# Patient Record
Sex: Male | Born: 1960 | Race: White | Hispanic: No | Marital: Married | Smoking: Never smoker
Health system: Southern US, Community
[De-identification: ages and names within clinical notes are randomized; demographics above are authoritative.]

## PROBLEM LIST (undated history)

## (undated) DIAGNOSIS — K581 Irritable bowel syndrome with constipation: Secondary | ICD-10-CM

## (undated) DIAGNOSIS — G8929 Other chronic pain: Secondary | ICD-10-CM

## (undated) DIAGNOSIS — G629 Polyneuropathy, unspecified: Secondary | ICD-10-CM

## (undated) DIAGNOSIS — F32A Depression, unspecified: Secondary | ICD-10-CM

## (undated) DIAGNOSIS — Z9689 Presence of other specified functional implants: Secondary | ICD-10-CM

## (undated) DIAGNOSIS — R413 Other amnesia: Secondary | ICD-10-CM

## (undated) DIAGNOSIS — L570 Actinic keratosis: Secondary | ICD-10-CM

## (undated) DIAGNOSIS — F329 Major depressive disorder, single episode, unspecified: Secondary | ICD-10-CM

## (undated) DIAGNOSIS — G43909 Migraine, unspecified, not intractable, without status migrainosus: Secondary | ICD-10-CM

## (undated) DIAGNOSIS — M797 Fibromyalgia: Secondary | ICD-10-CM

## (undated) HISTORY — DX: Actinic keratosis: L57.0

---

## 2018-05-16 ENCOUNTER — Ambulatory Visit
Admission: EM | Admit: 2018-05-16 | Discharge: 2018-05-16 | Disposition: A | Discharge status: Home / Self Care | Payer: Medicare Other | Attending: Family Medicine | Admitting: Family Medicine

## 2018-05-16 ENCOUNTER — Encounter: Payer: Self-pay | Admitting: Emergency Medicine

## 2018-05-16 DIAGNOSIS — J111 Influenza due to unidentified influenza virus with other respiratory manifestations: Secondary | ICD-10-CM | POA: Insufficient documentation

## 2018-05-16 DIAGNOSIS — J101 Influenza due to other identified influenza virus with other respiratory manifestations: Secondary | ICD-10-CM | POA: Diagnosis not present

## 2018-05-16 HISTORY — DX: Other chronic pain: G89.29

## 2018-05-16 HISTORY — DX: Polyneuropathy, unspecified: G62.9

## 2018-05-16 HISTORY — DX: Irritable bowel syndrome with constipation: K58.1

## 2018-05-16 HISTORY — DX: Migraine, unspecified, not intractable, without status migrainosus: G43.909

## 2018-05-16 HISTORY — DX: Fibromyalgia: M79.7

## 2018-05-16 HISTORY — DX: Major depressive disorder, single episode, unspecified: F32.9

## 2018-05-16 HISTORY — DX: Presence of other specified functional implants: Z96.89

## 2018-05-16 HISTORY — DX: Other amnesia: R41.3

## 2018-05-16 HISTORY — DX: Depression, unspecified: F32.A

## 2018-05-16 LAB — RAPID INFLUENZA A&B ANTIGENS
Influenza A (ARMC): POSITIVE — AB
Influenza B (ARMC): NEGATIVE

## 2018-05-16 MED ORDER — OSELTAMIVIR PHOSPHATE 75 MG PO CAPS: 75.0000 mg | ORAL_CAPSULE | Freq: Two times a day (BID) | ORAL | 0 refills | Status: AC

## 2018-05-16 MED ORDER — OSELTAMIVIR PHOSPHATE 75 MG PO CAPS: 75.0000 mg | ORAL_CAPSULE | Freq: Two times a day (BID) | ORAL | 0 refills | Status: DC

## 2018-05-16 NOTE — ED Triage Notes (Signed)
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

## 2018-05-16 NOTE — Discharge Instructions (Signed)
You have the flu.  Medication as prescribed.  Try mag citrate for constipation. I would recommend daily miralax after use of the mag citrate.  Take care  Dr. Lacinda Axon

## 2018-05-16 NOTE — ED Provider Notes (Signed)
[facility]    CSN: [id number] Arrival date & time: 05/16/18  0910  History   Chief Complaint Chief Complaint  Patient presents with  . Generalized Body Aches   HPI  58 year old male presents with multiple complaints.  Patient reports that he is not been feeling well since Sunday.  He reports multiple complaints: Postnasal drip, body aches, throat burning, nausea, cough, severe constipation, weakness.  No documented fever.  Patient feels very poorly.  He states that he is most bothered by the body aches as well as a constipation.  He has known IBS with constipation and is also on chronic pain medication.  No known exacerbating or relieving factors.  Symptoms are severe.  No other complaints.  History reviewed as below. Past Medical History:  Diagnosis Date  . Chronic pain   . Depression   . Fibromyalgia   . Irritable bowel syndrome with constipation   . Memory deficits   . Migraines   . Polyneuropathy   . Spinal cord stimulator status     Home Medications    Prior to Admission medications   Medication Sig Start Date End Date Taking? Authorizing Provider  diclofenac sodium (VOLTAREN) 1 % GEL Apply topically 4 (four) times daily.   Yes [provider]  Lifitegrast Shirley Friar) 5 % SOLN Apply to eye.   Yes [provider]  losartan-hydrochlorothiazide (HYZAAR) 50-12.5 MG tablet Take 1 tablet by mouth daily.   Yes [provider]  PROPRANOLOL HCL PO Take by mouth.   Yes [provider]  tamsulosin (FLOMAX) 0.4 MG CAPS capsule Take 0.4 mg by mouth.   Yes [provider]  buprenorphine (BUTRANS) 20 MCG/HR PTWK patch Butrans 20 mcg/hour transdermal patch    [provider]  Butalbital-APAP-Caffeine 50-300-40 MG CAPS butalbital-acetaminophen-caffeine 50 mg-300 mg-40 mg capsule    [provider]  clonazePAM (KLONOPIN) 0.5 MG tablet clonazepam 0.5 mg tablet    [provider]  cyclobenzaprine (FLEXERIL)  10 MG tablet cyclobenzaprine 10 mg tablet    [provider]  Fe Fum-FA-B Cmp-C-Zn-Mg-Mn-Cu (HEMATINIC PLUS VIT/MINERALS) 106-1 MG TABS Take by mouth.    [provider]  hydrocortisone (ANUCORT-HC) 25 MG suppository Anucort-HC 25 mg suppository    [provider]  oseltamivir (TAMIFLU) 75 MG capsule Take 1 capsule (75 mg total) by mouth every 12 (twelve) hours. 05/16/18   Coral Spikes, DO  Testosterone Enanthate (XYOSTED) 75 MG/0.5ML SOAJ Xyosted 75 mg/0.5 mL subcutaneous auto-injector  Inject 75 mg every week by subcutaneous route.    [provider]  traZODone (DESYREL) 100 MG tablet trazodone 100 mg tablet    [provider]  triamcinolone (KENALOG) 0.1 % paste Place onto teeth.    [provider]  triamcinolone ointment (KENALOG) 0.1 % triamcinolone acetonide 0.1 % topical ointment    [provider]   Social History Social History   Tobacco Use  . Smoking status: Never Smoker  Substance Use Topics  . Alcohol use: Not on file  . Drug use: Not on file     Allergies   Ethyl chloride; Lidocaine; Lyrica [pregabalin]; Penicillins; Phenergan [promethazine hcl]; and Tizanidine   Review of Systems Review of Systems Per HPI  Physical Exam Triage Vital Signs ED Triage Vitals  Enc Vitals Group     BP 05/16/18 0934 101/71     Pulse Rate 05/16/18 0934 76     Resp 05/16/18 0934 16     Temp 05/16/18 0934 98.8 F (37.1 C)  Temp Source 05/16/18 0934 Oral     SpO2 05/16/18 0934 99 %     Weight 05/16/18 0935 175 lb (79.4 kg)     Height 05/16/18 0935 6' (1.829 m)     Head Circumference --      Peak Flow --      Pain Score 05/16/18 0935 7     Pain Loc --      Pain Edu? --      Excl. in [place]? --    Updated Vital Signs BP 101/71 (BP Location: Right Arm)   Pulse 76   Temp 98.8 F (37.1 C) (Oral)   Resp 16   Ht 6' (1.829 m)   Wt 79.4 kg   SpO2 99%   BMI 23.73 kg/m   Visual Acuity Right Eye Distance:   Left Eye  Distance:   Bilateral Distance:    Right Eye Near:   Left Eye Near:    Bilateral Near:     Physical Exam Vitals signs and nursing note reviewed.  Constitutional:      Comments: Appears in pain.  HENT:     Head: Normocephalic and atraumatic.     Mouth/Throat:     Pharynx: Oropharynx is clear. No posterior oropharyngeal erythema.  Eyes:     General:        Right eye: No discharge.        Left eye: No discharge.     Conjunctiva/sclera: Conjunctivae normal.  Cardiovascular:     Rate and Rhythm: Normal rate and regular rhythm.  Pulmonary:     Effort: Pulmonary effort is normal.     Breath sounds: Normal breath sounds.  Abdominal:     General: Bowel sounds are normal. There is no distension.     Palpations: Abdomen is soft.  Neurological:     Mental Status: He is alert.  Psychiatric:        Mood and Affect: Mood normal.        Behavior: Behavior normal.     UC Treatments / Results  Labs (all labs ordered are listed, but only abnormal results are displayed) Labs Reviewed  RAPID INFLUENZA A&B ANTIGENS ([facility] ONLY) - Abnormal; Notable for the following components:      Result Value   Influenza A ([facility]) POSITIVE (*)    All other components within normal limits    EKG None  Radiology No results found.  Procedures Procedures (including critical care time)  Medications Ordered in UC Medications - No data to display  Initial Impression / Assessment and Plan / UC Course  I have reviewed the triage vital signs and the nursing notes.  Pertinent labs & imaging results that were available during my care of the patient were reviewed by me and considered in my medical decision making (see chart for details).    58 year old male presents with influenza.  Given severity of symptoms and lack of improvement, I am placing him on Tamiflu.  Advised mag citrate for constipation followed by MiraLAX daily.  Final Clinical Impressions(s) / UC Diagnoses   Final diagnoses:    Influenza     Discharge Instructions     You have the flu.  Medication as prescribed.  Try mag citrate for constipation. I would recommend daily miralax after use of the mag citrate.  Take care  Dr. Lacinda Axon   ED Prescriptions    Medication Sig Dispense Auth. Provider   oseltamivir (TAMIFLU) 75 MG capsule  (Status: Discontinued) Take 1 capsule (75 mg total)  by mouth every 12 (twelve) hours. 10 capsule Thersa Salt G, DO   oseltamivir (TAMIFLU) 75 MG capsule  (Status: Discontinued) Take 1 capsule (75 mg total) by mouth every 12 (twelve) hours. 10 capsule Thersa Salt G, DO   oseltamivir (TAMIFLU) 75 MG capsule Take 1 capsule (75 mg total) by mouth every 12 (twelve) hours. 10 capsule Coral Spikes, DO     Controlled Substance Prescriptions [place] Controlled Substance Registry consulted? Not Applicable   Coral Spikes, DO 05/16/18 1104

## 2019-07-07 ENCOUNTER — Ambulatory Visit: Payer: Medicare Other | Admitting: Dermatology

## 2019-08-25 ENCOUNTER — Ambulatory Visit (INDEPENDENT_AMBULATORY_CARE_PROVIDER_SITE_OTHER): Payer: Medicare Other | Admitting: Dermatology

## 2019-08-25 ENCOUNTER — Other Ambulatory Visit: Payer: Self-pay

## 2019-08-25 DIAGNOSIS — L739 Follicular disorder, unspecified: Secondary | ICD-10-CM

## 2019-08-25 DIAGNOSIS — Z1283 Encounter for screening for malignant neoplasm of skin: Secondary | ICD-10-CM | POA: Diagnosis not present

## 2019-08-25 DIAGNOSIS — L299 Pruritus, unspecified: Secondary | ICD-10-CM

## 2019-08-25 DIAGNOSIS — L578 Other skin changes due to chronic exposure to nonionizing radiation: Secondary | ICD-10-CM | POA: Diagnosis not present

## 2019-08-25 DIAGNOSIS — L570 Actinic keratosis: Secondary | ICD-10-CM | POA: Diagnosis not present

## 2019-08-25 MED ORDER — EUCRISA 2 % EX OINT: TOPICAL_OINTMENT | CUTANEOUS | 2 refills | Status: DC

## 2019-08-25 NOTE — Progress Notes (Signed)
   Follow-Up Visit   Subjective  Bryce Atkinson is a 59 y.o. male who presents for the following: Annual Exam (UBSE), Follow-up (6 month follow up of AKs of scalp), and Rash (scalp - folliculitis treating with doxycycline 20mg  - well controlled). Patient presents for upper body skin exam for skin cancer screening and mole check.  Wife with pt and contributes to history  The following portions of the chart were reviewed this encounter and updated as appropriate:  Tobacco  Allergies  Meds  Problems  Med Hx  Surg Hx  Fam Hx     Review of Systems:  No other skin or systemic complaints except as noted in HPI or Assessment and Plan.  Objective  Well appearing patient in no apparent distress; mood and affect are within normal limits.  All skin waist up examined.  Objective  Left lat canthus, post neck, scalp (5): Erythematous thin papules/macules with gritty scale.   Objective  Scalp:   Objective  groin: Pruritus of the groin    Assessment & Plan    AK (actinic keratosis) (5) Left lat canthus, post neck, scalp  Destruction of lesion - Left lat canthus, post neck, scalp Complexity: simple   Destruction method: cryotherapy   Informed consent: discussed and consent obtained   Timeout:  patient name, date of birth, surgical site, and procedure verified Lesion destroyed using liquid nitrogen: Yes   Region frozen until ice ball extended beyond lesion: Yes   Outcome: patient tolerated procedure well with no complications   Post-procedure details: wound care instructions given    Folliculitis /rosacea of the scalp Scalp  Folliculitus well controlled - Continue Doxycycline 20mg  bid prn  Pruritus groin Undetermined etiology Patient did not feel Elidel was helpful. Ordered Medications: Crisaborole (EUCRISA) 2 % OINT  Actinic Damage - diffuse scaly erythematous macules with underlying dyspigmentation - Recommend daily broad spectrum sunscreen SPF 30+ to sun-exposed  areas, reapply every 2 hours as needed.  - Call for new or changing lesions.  Return in about 6 months (around 02/25/2020). IMarye Round,  CMA, am acting as scribe for Sarina Ser, MD .  Documentation: I have reviewed the above documentation for accuracy and completeness, and I agree with the above.  Sarina Ser, MD

## 2019-08-25 NOTE — Patient Instructions (Signed)
Cryotherapy Aftercare  . Wash gently with soap and water everyday.   . Apply Vaseline and Band-Aid daily until healed.  

## 2019-08-26 ENCOUNTER — Encounter: Payer: Self-pay | Admitting: Dermatology

## 2019-10-09 ENCOUNTER — Other Ambulatory Visit: Payer: Self-pay

## 2019-10-09 DIAGNOSIS — L299 Pruritus, unspecified: Secondary | ICD-10-CM

## 2019-10-09 MED ORDER — EUCRISA 2 % EX OINT: TOPICAL_OINTMENT | CUTANEOUS | 2 refills | Status: AC

## 2019-10-09 NOTE — Progress Notes (Signed)
Prescription changed to [facility] per patients request.

## 2019-10-21 ENCOUNTER — Encounter: Payer: Self-pay | Admitting: Dermatology

## 2019-10-21 NOTE — Telephone Encounter (Signed)
Patient advised Dr. Nehemiah Massed reviewed pictures sent via MyChart and the spots patient is concerned about seems to be keratin plugs. I told patient if he continues to have problems he should ask MD to do a biopsy, JS

## 2021-01-12 ENCOUNTER — Emergency Department
Admission: EM | Admit: 2021-01-12 | Discharge: 2021-01-12 | Disposition: A | Discharge status: Home / Self Care | Payer: Medicare Other | Attending: Emergency Medicine | Admitting: Emergency Medicine

## 2021-01-12 ENCOUNTER — Emergency Department: Payer: Medicare Other

## 2021-01-12 ENCOUNTER — Other Ambulatory Visit: Payer: Self-pay

## 2021-01-12 DIAGNOSIS — K219 Gastro-esophageal reflux disease without esophagitis: Secondary | ICD-10-CM | POA: Insufficient documentation

## 2021-01-12 DIAGNOSIS — J029 Acute pharyngitis, unspecified: Secondary | ICD-10-CM | POA: Diagnosis present

## 2021-01-12 DIAGNOSIS — R0789 Other chest pain: Secondary | ICD-10-CM

## 2021-01-12 LAB — BASIC METABOLIC PANEL
Anion gap: 9 (ref 5–15)
BUN: 11 mg/dL (ref 8–23)
CO2: 26 mmol/L (ref 22–32)
Calcium: 10.5 mg/dL — ABNORMAL HIGH (ref 8.9–10.3)
Chloride: 104 mmol/L (ref 98–111)
Creatinine, Ser: 0.93 mg/dL (ref 0.61–1.24)
GFR, Estimated: >60 mL/min (ref >60)
Glucose, Bld: 102 mg/dL — ABNORMAL HIGH (ref 70–99)
Potassium: 3.6 mmol/L (ref 3.5–5.1)
Sodium: 139 mmol/L (ref 135–145)

## 2021-01-12 LAB — CBC
HCT: 42.5 % (ref 39.0–52.0)
Hemoglobin: 15.1 g/dL (ref 13.0–17.0)
MCH: 30.8 pg (ref 26.0–34.0)
MCHC: 35.5 g/dL (ref 30.0–36.0)
MCV: 86.6 fL (ref 80.0–100.0)
Platelets: 158 10*3/uL (ref 150–400)
RBC: 4.91 MIL/uL (ref 4.22–5.81)
RDW: 12.7 % (ref 11.5–15.5)
WBC: 9.8 10*3/uL (ref 4.0–10.5)
nRBC: 0 % (ref 0.0–0.2)

## 2021-01-12 LAB — TROPONIN I (HIGH SENSITIVITY): Troponin I (High Sensitivity): 4 ng/L (ref <18)

## 2021-01-12 MED ORDER — METOCLOPRAMIDE HCL 10 MG PO TABS: 10.0000 mg | ORAL_TABLET | Freq: Four times a day (QID) | ORAL | 0 refills | Status: AC | PRN

## 2021-01-12 MED ORDER — SUCRALFATE 1 G PO TABS: 1.0000 g | ORAL_TABLET | Freq: Four times a day (QID) | ORAL | 1 refills | Status: AC

## 2021-01-12 MED ORDER — FAMOTIDINE 20 MG PO TABS: 20.0000 mg | ORAL_TABLET | Freq: Two times a day (BID) | ORAL | 0 refills | Status: AC

## 2021-01-12 NOTE — ED Triage Notes (Signed)
Pt to ED for chest and epigastric pain that started last night. States also feels like cannot get enough oxygen.  Denies n/v Went to doctor today and reports did not see anything wrong with esophagus.  Denies cardiac hx

## 2021-01-12 NOTE — ED Provider Notes (Signed)
[facility] Emergency Department Provider Note  ____________________________________________  Time seen: Approximately 5:02 PM  I have reviewed the triage vital signs and the nursing notes.   HISTORY  Chief Complaint Chest Pain and Shortness of Breath    HPI Bryce Atkinson is a 60 y.o. male with a past history of depression and fibromyalgia who comes ED complaining of sore throat that started last night, radiates down to his upper abdomen.  Not exertional, not pleuritic, worse laying down, no alleviating factors.  Tried Tums last night and 2 hours later he was feeling better.  It does feel like previous episode of GERD.  Feels like burning.   Denies shortness of breath vomiting or diaphoresis.  Past Medical History:  Diagnosis Date   Actinic keratosis    Chronic pain    Depression    Fibromyalgia    Irritable bowel syndrome with constipation    Memory deficits    Migraines    Polyneuropathy    Spinal cord stimulator status      There are no problems to display for this patient.    No past surgical history on file.   Prior to Admission medications   Medication Sig Start Date End Date Taking? Authorizing Provider  famotidine (PEPCID) 20 MG tablet Take 1 tablet (20 mg total) by mouth 2 (two) times daily. 01/12/21  Yes Carrie Mew, MD  metoCLOPramide (REGLAN) 10 MG tablet Take 1 tablet (10 mg total) by mouth every 6 (six) hours as needed. 01/12/21  Yes Carrie Mew, MD  sucralfate (CARAFATE) 1 g tablet Take 1 tablet (1 g total) by mouth 4 (four) times daily. 01/12/21  Yes Carrie Mew, MD  buprenorphine (BUTRANS) 20 MCG/HR PTWK patch Butrans 20 mcg/hour transdermal patch    [provider]  Butalbital-APAP-Caffeine 50-300-40 MG CAPS butalbital-acetaminophen-caffeine 50 mg-300 mg-40 mg capsule    [provider]  clonazePAM (KLONOPIN) 0.5 MG tablet clonazepam 0.5 mg tablet    [provider]  Crisaborole  (EUCRISA) 2 % OINT Apply to skin qd-bid 10/09/19   Ralene Bathe, MD  cyclobenzaprine (FLEXERIL) 10 MG tablet cyclobenzaprine 10 mg tablet    [provider]  diclofenac sodium (VOLTAREN) 1 % GEL Apply topically 4 (four) times daily.    [provider]  Fe Fum-FA-B Cmp-C-Zn-Mg-Mn-Cu (HEMATINIC PLUS VIT/MINERALS) 106-1 MG TABS Take by mouth.    [provider]  hydrocortisone (ANUCORT-HC) 25 MG suppository Anucort-HC 25 mg suppository    [provider]  Lifitegrast Shirley Friar) 5 % SOLN Apply to eye.    [provider]  losartan-hydrochlorothiazide (HYZAAR) 50-12.5 MG tablet Take 1 tablet by mouth daily.    [provider]  oseltamivir (TAMIFLU) 75 MG capsule Take 1 capsule (75 mg total) by mouth every 12 (twelve) hours. 05/16/18   Thersa Salt G, DO  PROPRANOLOL HCL PO Take by mouth.    [provider]  tamsulosin (FLOMAX) 0.4 MG CAPS capsule Take 0.4 mg by mouth.    [provider]  Testosterone Enanthate (XYOSTED) 75 MG/0.5ML SOAJ Xyosted 75 mg/0.5 mL subcutaneous auto-injector  Inject 75 mg every week by subcutaneous route.    [provider]  traZODone (DESYREL) 100 MG tablet trazodone 100 mg tablet    [provider]  triamcinolone (KENALOG) 0.1 % paste Place onto teeth.    [provider]  triamcinolone ointment (KENALOG) 0.1 % triamcinolone acetonide 0.1 % topical ointment    [provider]     Allergies Ethyl chloride,  Lidocaine, Lyrica [pregabalin], Penicillins, Phenergan [promethazine hcl], and Tizanidine   No family history on file.  Social History Social History   Tobacco Use   Smoking status: Never   Smokeless tobacco: Never    Review of Systems  Constitutional:   No fever or chills.  ENT:   Positive sore throat. No rhinorrhea. Cardiovascular:   No chest pain or syncope. Respiratory:   No dyspnea or cough. Gastrointestinal:   Negative for abdominal pain,  vomiting and diarrhea.  Musculoskeletal:   Negative for focal pain or swelling All other systems reviewed and are negative except as documented above in ROS and HPI.  ____________________________________________   PHYSICAL EXAM:  VITAL SIGNS: ED Triage Vitals  Enc Vitals Group     BP 01/12/21 1355 116/77     Pulse Rate 01/12/21 1355 75     Resp 01/12/21 1355 18     Temp 01/12/21 1355 98.1 F (36.7 C)     Temp Source 01/12/21 1355 Oral     SpO2 01/12/21 1355 98 %     Weight 01/12/21 1358 183 lb (83 kg)     Height 01/12/21 1358 6' (1.829 m)     Head Circumference --      Peak Flow --      Pain Score 01/12/21 1357 5     Pain Loc --      Pain Edu? --      Excl. in [place]? --     Vital signs reviewed, nursing assessments reviewed.   Constitutional:   Alert and oriented. Non-toxic appearance. Eyes:   Conjunctivae are normal. EOMI. PERRL. ENT      Head:   Normocephalic and atraumatic.      Nose:   Normal.      Mouth/Throat:   Oropharyngeal erythema      Neck:   No meningismus. Full ROM. Hematological/Lymphatic/Immunilogical:   No cervical lymphadenopathy. Cardiovascular:   RRR. Symmetric bilateral radial and DP pulses.  No murmurs. Cap refill less than 2 seconds. Respiratory:   Normal respiratory effort without tachypnea/retractions. Breath sounds are clear and equal bilaterally. No wheezes/rales/rhonchi. Gastrointestinal:   Soft with mild left upper quadrant tenderness.  Non distended. There is no CVA tenderness.  No rebound, rigidity, or guarding. Genitourinary:   deferred Musculoskeletal:   Normal range of motion in all extremities. No joint effusions.  No lower extremity tenderness.  No edema. Neurologic:   Normal speech and language.  Motor grossly intact. No acute focal neurologic deficits are appreciated.  Skin:    Skin is warm, dry and intact. No rash noted.  No petechiae, purpura, or bullae.  ____________________________________________    LABS (pertinent  positives/negatives) (all labs ordered are listed, but only abnormal results are displayed) Labs Reviewed  BASIC METABOLIC PANEL - Abnormal; Notable for the following components:      Result Value   Glucose, Bld 102 (*)    Calcium 10.5 (*)    All other components within normal limits  CBC  TROPONIN I (HIGH SENSITIVITY)   ____________________________________________   EKG  Interpreted by me  Date: 01/12/2021  Rate: 68  Rhythm: normal sinus rhythm  QRS Axis: normal  Intervals: normal  ST/T Wave abnormalities: normal  Conduction Disutrbances: none  Narrative Interpretation: unremarkable     ____________________________________________    RADIOLOGY  DG Chest 2 View  Result Date: 01/12/2021 CLINICAL DATA:  Chest and epigastric pain since last night EXAM: CHEST - 2 VIEW COMPARISON:  None FINDINGS: Intraspinal stimulator projects over midthoracic  spine. Normal heart size, mediastinal contours, and pulmonary vascularity. Calcified granuloma RIGHT mid lung. No pulmonary infiltrate, pleural effusion, or pneumothorax. Osseous structures unremarkable. IMPRESSION: Old granulomatous disease. No acute abnormalities. Electronically Signed   By: Lavonia Dana M.D.   On: 01/12/2021 15:06    ____________________________________________   PROCEDURES Procedures  ____________________________________________    CLINICAL IMPRESSION / ASSESSMENT AND PLAN / ED COURSE  Medications ordered in the ED: Medications - No data to display  Pertinent labs & imaging results that were available during my care of the patient were reviewed by me and considered in my medical decision making (see chart for details).  Bryce Atkinson was evaluated in Emergency Department on 01/12/2021 for the symptoms described in the history of present illness. He was evaluated in the context of the global COVID-19 pandemic, which necessitated consideration that the patient might be at risk for infection with the SARS-CoV-2  virus that causes COVID-19. Institutional protocols and algorithms that pertain to the evaluation of patients at risk for COVID-19 are in a state of rapid change based on information released by regulatory bodies including the CDC and federal and state organizations. These policies and algorithms were followed during the patient's care in the ED.   Patient presents with sore throat, atypical chest pain. Considering the patient's symptoms, medical history, and physical examination today, I have low suspicion for ACS, PE, TAD, pneumothorax, carditis, mediastinitis, pneumonia, CHF, or sepsis. Presentation is consistent with GERD and gastritis.  Will treat with Carafate Reglan and Pepcid.  Vital signs are normal, EKG is normal.  Chest x-ray viewed and interpreted by me, appears normal.  Radiology report reviewed.  Labs are all unremarkable.  Troponin sent by triage protocol, but pain is noncardiac and cardiac work-up is not indicated.      ____________________________________________   FINAL CLINICAL IMPRESSION(S) / ED DIAGNOSES    Final diagnoses:  Atypical chest pain  Gastroesophageal reflux disease, unspecified whether esophagitis present     ED Discharge Orders          Ordered    sucralfate (CARAFATE) 1 g tablet  4 times daily        01/12/21 1702    famotidine (PEPCID) 20 MG tablet  2 times daily        01/12/21 1702    metoCLOPramide (REGLAN) 10 MG tablet  Every 6 hours PRN        01/12/21 1702            Portions of this note were generated with dragon dictation software. Dictation errors may occur despite best attempts at proofreading.    Carrie Mew, MD 01/12/21 1705

## 2021-12-21 ENCOUNTER — Ambulatory Visit: Payer: TRICARE For Life (TFL) | Admitting: Dermatology

## 2021-12-21 DIAGNOSIS — Z1283 Encounter for screening for malignant neoplasm of skin: Secondary | ICD-10-CM

## 2021-12-21 DIAGNOSIS — Z5111 Encounter for antineoplastic chemotherapy: Secondary | ICD-10-CM

## 2021-12-21 DIAGNOSIS — L570 Actinic keratosis: Secondary | ICD-10-CM | POA: Diagnosis not present

## 2021-12-21 DIAGNOSIS — L814 Other melanin hyperpigmentation: Secondary | ICD-10-CM

## 2021-12-21 DIAGNOSIS — L578 Other skin changes due to chronic exposure to nonionizing radiation: Secondary | ICD-10-CM

## 2021-12-21 DIAGNOSIS — Z79899 Other long term (current) drug therapy: Secondary | ICD-10-CM

## 2021-12-21 DIAGNOSIS — L821 Other seborrheic keratosis: Secondary | ICD-10-CM

## 2021-12-21 DIAGNOSIS — L820 Inflamed seborrheic keratosis: Secondary | ICD-10-CM | POA: Diagnosis not present

## 2021-12-21 DIAGNOSIS — L209 Atopic dermatitis, unspecified: Secondary | ICD-10-CM | POA: Diagnosis not present

## 2021-12-21 DIAGNOSIS — D1800 Hemangioma unspecified site: Secondary | ICD-10-CM

## 2021-12-21 DIAGNOSIS — D229 Melanocytic nevi, unspecified: Secondary | ICD-10-CM

## 2021-12-21 MED ORDER — MOMETASONE FUROATE 0.1 % EX CREA: TOPICAL_CREAM | CUTANEOUS | 1 refills | Status: AC

## 2021-12-21 MED ORDER — FLUOROURACIL 5 % EX CREA: TOPICAL_CREAM | CUTANEOUS | 0 refills | Status: AC

## 2021-12-21 NOTE — Progress Notes (Unsigned)
Follow-Up Visit   Subjective  Bryce Atkinson is a 61 y.o. male who presents for the following: Annual Exam (Itching on the buttocks and arms - he has multiple irritated skin lesions scattered on the trunk, and extremities that he would like checked today). The patient presents for Total-Body Skin Exam (TBSE) for skin cancer screening and mole check.  The patient has spots, moles and lesions to be evaluated, some may be new or changing and the patient has concerns that these could be cancer.  The following portions of the chart were reviewed this encounter and updated as appropriate:   Tobacco  Allergies  Meds  Problems  Med Hx  Surg Hx  Fam Hx     Review of Systems:  No other skin or systemic complaints except as noted in HPI or Assessment and Plan.  Objective  Well appearing patient in no apparent distress; mood and affect are within normal limits.  A full examination was performed including scalp, head, eyes, ears, nose, lips, neck, chest, axillae, abdomen, back, buttocks, bilateral upper extremities, bilateral lower extremities, hands, feet, fingers, toes, fingernails, and toenails. All findings within normal limits unless otherwise noted below.  arms and hands x 19, L lower leg x 1 (20) Erythematous stuck-on, waxy papule or plaque  Face, scalp, ears x 6 (6) Erythematous thin papules/macules with gritty scale.   Buttocks Clear today.   Assessment & Plan   Inflamed seborrheic keratosis (20) arms and hands x 19, L lower leg x 1 Symptomatic, irritating, patient would like treated. Destruction of lesion - arms and hands x 19, L lower leg x 1 Complexity: simple   Destruction method: cryotherapy   Informed consent: discussed and consent obtained   Timeout:  patient name, date of birth, surgical site, and procedure verified Lesion destroyed using liquid nitrogen: Yes   Region frozen until ice ball extended beyond lesion: Yes   Outcome: patient tolerated procedure well with no  complications   Post-procedure details: wound care instructions given    AK (actinic keratosis) (6) Face, scalp, ears x 6 Destruction of lesion - Face, scalp, ears x 6 Complexity: simple   Destruction method: cryotherapy   Informed consent: discussed and consent obtained   Timeout:  patient name, date of birth, surgical site, and procedure verified Lesion destroyed using liquid nitrogen: Yes   Region frozen until ice ball extended beyond lesion: Yes   Outcome: patient tolerated procedure well with no complications   Post-procedure details: wound care instructions given    Atopic dermatitis,  Buttocks With xerosis and pruritis -   Start CeraVe cream daily.   If itching is persistent start Mometasone 0.1% cream QD-BID PRN.   Atopic dermatitis (eczema) is a chronic, relapsing, pruritic condition that can significantly affect quality of life. It is often associated with allergic rhinitis and/or asthma and can require treatment with topical medications, phototherapy, or in severe cases biologic injectable medication (Dupixent; Adbry) or Oral JAK inhibitors.  Topical steroids (such as triamcinolone, fluocinolone, fluocinonide, mometasone, clobetasol, halobetasol, betamethasone, hydrocortisone) can cause thinning and lightening of the skin if they are used for too long in the same area. Your physician has selected the right strength medicine for your problem and area affected on the body. Please use your medication only as directed by your physician to prevent side effects.   mometasone (ELOCON) 0.1 % cream - Buttocks For itching apply to aa's buttocks QD-BID PRN.  Lentigines - Scattered tan macules - Due to sun exposure - Benign-appearing,  observe - Recommend daily broad spectrum sunscreen SPF 30+ to sun-exposed areas, reapply every 2 hours as needed. - Call for any changes  Seborrheic Keratoses - Stuck-on, waxy, tan-brown papules and/or plaques  - Benign-appearing - Discussed  benign etiology and prognosis. - Observe - Call for any changes  Melanocytic Nevi - Tan-brown and/or pink-flesh-colored symmetric macules and papules - Benign appearing on exam today - Observation - Call clinic for new or changing moles - Recommend daily use of broad spectrum spf 30+ sunscreen to sun-exposed areas.   Hemangiomas - Red papules - Discussed benign nature - Observe - Call for any changes  Actinic Damage - Severe, confluent actinic changes with pre-cancerous actinic keratoses  - Severe, chronic, not at goal, secondary to cumulative UV radiation exposure over time - diffuse scaly erythematous macules and papules with underlying dyspigmentation - Discussed Prescription "Field Treatment" for Severe, Chronic Confluent Actinic Changes with Pre-Cancerous Actinic Keratoses Field treatment involves treatment of an entire area of skin that has confluent Actinic Changes (Sun/ Ultraviolet light damage) and PreCancerous Actinic Keratoses by method of PhotoDynamic Therapy (PDT) and/or prescription Topical Chemotherapy agents such as 5-fluorouracil, 5-fluorouracil/calcipotriene, and/or imiquimod.  The purpose is to decrease the number of clinically evident and subclinical PreCancerous lesions to prevent progression to development of skin cancer by chemically destroying early precancer changes that may or may not be visible.  It has been shown to reduce the risk of developing skin cancer in the treated area. As a result of treatment, redness, scaling, crusting, and open sores may occur during treatment course. One or more than one of these methods may be used and may have to be used several times to control, suppress and eliminate the PreCancerous changes. Discussed treatment course, expected reaction, and possible side effects. - Recommend daily broad spectrum sunscreen SPF 30+ to sun-exposed areas, reapply every 2 hours as needed.  - Staying in the shade or wearing long sleeves, sun glasses  (UVA+UVB protection) and wide brim hats (4-inch brim around the entire circumference of the hat) are also recommended. - Call for new or changing lesions. - Start 5FU/Calcipotriene cream BID x 7 days to the forehead and temples.   Skin cancer screening performed today.  Return in about 6 months (around 06/21/2022) for AK, ISK, and atopic dermatitis follow up patient to see derm in [place].  Luther Redo, CMA, am acting as scribe for Sarina Ser, MD . Documentation: I have reviewed the above documentation for accuracy and completeness, and I agree with the above.  Sarina Ser, MD

## 2021-12-21 NOTE — Patient Instructions (Addendum)
- Start 5-fluorouracil/calcipotriene cream twice a day for 7 days to affected areas including the forehead and temples. Prescription sent to [facility]. Patient advised they will receive an email to purchase the medication online and have it sent to their home. Patient provided with handout reviewing treatment course and side effects and advised to call or message [place] on MyChart with any concerns.   5-Fluorouracil/Calcipotriene Patient Education   Actinic keratoses are the dry, red scaly spots on the skin caused by sun damage. A portion of these spots can turn into skin cancer with time, and treating them can help prevent development of skin cancer.   Treatment of these spots requires removal of the defective skin cells. There are various ways to remove actinic keratoses, including freezing with liquid nitrogen, treatment with creams, or treatment with a blue light procedure in the office.   5-fluorouracil cream is a topical cream used to treat actinic keratoses. It works by interfering with the growth of abnormal fast-growing skin cells, such as actinic keratoses. These cells peel off and are replaced by healthy ones.   5-fluorouracil/calcipotriene is a combination of the 5-fluorouracil cream with a vitamin D analog cream called calcipotriene. The calcipotriene alone does not treat actinic keratoses. However, when it is combined with 5-fluorouracil, it helps the 5-fluorouracil treat the actinic keratoses much faster so that the same results can be achieved with a much shorter treatment time.  INSTRUCTIONS FOR 5-FLUOROURACIL/CALCIPOTRIENE CREAM:   5-fluorouracil/calcipotriene cream typically only needs to be used for 4-7 days. A thin layer should be applied twice a day to the treatment areas recommended by your physician.   If your physician prescribed you separate tubes of 5-fluourouracil and calcipotriene, apply a thin layer of 5-fluorouracil followed by a thin layer of  calcipotriene.   Avoid contact with your eyes, nostrils, and mouth. Do not use 5-fluorouracil/calcipotriene cream on infected or open wounds.   You will develop redness, irritation and some crusting at areas where you have pre-cancer damage/actinic keratoses. IF YOU DEVELOP PAIN, BLEEDING, OR SIGNIFICANT CRUSTING, STOP THE TREATMENT EARLY - you have already gotten a good response and the actinic keratoses should clear up well.  Wash your hands after applying 5-fluorouracil 5% cream on your skin.   A moisturizer or sunscreen with a minimum SPF 30 should be applied each morning.   Once you have finished the treatment, you can apply a thin layer of Vaseline twice a day to irritated areas to soothe and calm the areas more quickly. If you experience significant discomfort, contact your physician.  For some patients it is necessary to repeat the treatment for best results.  SIDE EFFECTS: When using 5-fluorouracil/calcipotriene cream, you may have mild irritation, such as redness, dryness, swelling, or a mild burning sensation. This usually resolves within 2 weeks. The more actinic keratoses you have, the more redness and inflammation you can expect during treatment. Eye irritation has been reported rarely. If this occurs, please let us know.   If you have any trouble using this cream, please call the office. If you have any other questions about this information, please do not hesitate to ask me before you leave the office.   Due to recent changes in healthcare laws, you may see results of your pathology and/or laboratory studies on MyChart before the doctors have had a chance to review them. We understand that in some cases there may be results that are confusing or concerning to you. Please understand that not all results are  received at the same time and often the doctors may need to interpret multiple results in order to provide you with the best plan of care or course of treatment. Therefore, we  ask that you please give [place] 2 business days to thoroughly review all your results before contacting the office for clarification. Should we see a critical lab result, you will be contacted sooner.   If You Need Anything After Your Visit  If you have any questions or concerns for your doctor, please call our main line at [phone] and press option 4 to reach your doctor's medical assistant. If no one answers, please leave a voicemail as directed and we will return your call as soon as possible. Messages left after 4 pm will be answered the following business day.   You may also send [place] a message via [place]. We typically respond to MyChart messages within 1-2 business days.  For prescription refills, please ask your pharmacy to contact our office. Our fax number is [phone].  If you have an urgent issue when the clinic is closed that cannot wait until the next business day, you can page your doctor at the number below.    Please note that while we do our best to be available for urgent issues outside of office hours, we are not available 24/7.   If you have an urgent issue and are unable to reach [place], you may choose to seek medical care at your doctor's office, retail clinic, urgent care center, or emergency room.  If you have a medical emergency, please immediately call 911 or go to the emergency department.  Pager Numbers  - Dr. Nehemiah Massed: [phone]  - Dr. Laurence Ferrari: [phone]  - Dr. Nicole Kindred: [phone]  In the event of inclement weather, please call our main line at [phone] for an update on the status of any delays or closures.  Dermatology Medication Tips: Please keep the boxes that topical medications come in in order to help keep track of the instructions about where and how to use these. Pharmacies typically print the medication instructions only on the boxes and not directly on the medication tubes.   If your medication is too expensive, please contact our office  at [phone] option 4 or send [place] a message through [place].   We are unable to tell what your co-pay for medications will be in advance as this is different depending on your insurance coverage. However, we may be able to find a substitute medication at lower cost or fill out paperwork to get insurance to cover a needed medication.   If a prior authorization is required to get your medication covered by your insurance company, please allow [place] 1-2 business days to complete this process.  Drug prices often vary depending on where the prescription is filled and some pharmacies may offer cheaper prices.  The website www.goodrx.com contains coupons for medications through different pharmacies. The prices here do not account for what the cost may be with help from insurance (it may be cheaper with your insurance), but the website can give you the price if you did not use any insurance.  - You can print the associated coupon and take it with your prescription to the pharmacy.  - You may also stop by our office during regular business hours and pick up a GoodRx coupon card.  - If you need your prescription sent electronically to a different pharmacy, notify our office through [facility] or by phone at [phone] option 4.  Si Usted Necesita Algo Despus de Su Visita  Tambin puede enviarnos un mensaje a travs de Pharmacist, community. Por lo general respondemos a los mensajes de MyChart en el transcurso de 1 a 2 das hbiles.  Para renovar recetas, por favor pida a su farmacia que se ponga en contacto con nuestra oficina. Harland Dingwall de fax es [place] [phone].  Si tiene un asunto urgente cuando la clnica est cerrada y que no puede esperar hasta el siguiente da hbil, puede llamar/localizar a su doctor(a) al nmero que aparece a continuacin.   Por favor, tenga en cuenta que aunque hacemos todo lo posible para estar disponibles para asuntos urgentes fuera del horario de [place], no estamos  disponibles las 24 horas del da, los 7 das de la [place].   Si tiene un problema urgente y no puede comunicarse con nosotros, puede optar por buscar atencin mdica  en el consultorio de su doctor(a), en una clnica privada, en un centro de atencin urgente o en una sala de emergencias.  Si tiene Engineering geologist, por favor llame inmediatamente al 911 o vaya a la sala de emergencias.  Nmeros de bper  - Dr. Nehemiah Massed: [phone]  - Dra. Moye: [phone]  - Dra. Nicole Kindred: [phone]  En caso de inclemencias del [place], por favor llame a Johnsie Kindred principal al [phone] para una actualizacin sobre el [place] de cualquier retraso o cierre.  Consejos para la medicacin en dermatologa: Por favor, guarde las cajas en las que vienen los medicamentos de uso tpico para ayudarle a seguir las instrucciones sobre dnde y cmo usarlos. Las farmacias generalmente imprimen las instrucciones del medicamento slo en las cajas y no directamente en los tubos del [place].   Si su medicamento es muy caro, por favor, pngase en contacto con Zigmund Daniel llamando al [phone] y presione la opcin 4 o envenos un mensaje a travs de Pharmacist, community.   No podemos decirle cul ser su copago por los medicamentos por adelantado ya que esto es diferente dependiendo de la cobertura de su seguro. Sin embargo, es posible que podamos encontrar un medicamento sustituto a Electrical engineer un formulario para que el seguro cubra el medicamento que se considera necesario.   Si se requiere una autorizacin previa para que su compaa de seguros [place] su medicamento, por favor permtanos de 1 a 2 das hbiles para completar este proceso.  Los precios de los medicamentos varan con frecuencia dependiendo del Environmental consultant de dnde se surte la receta y alguna farmacias pueden ofrecer precios ms baratos.  El sitio web www.goodrx.com tiene cupones para medicamentos de Airline pilot. Los precios aqu no  tienen en cuenta lo que podra costar con la ayuda del seguro (puede ser ms barato con su seguro), pero el sitio web puede darle el precio si no utiliz Research scientist (physical sciences).  - Puede imprimir el cupn correspondiente y llevarlo con su receta a la farmacia.  - Tambin puede pasar por nuestra oficina durante el horario de atencin regular y Charity fundraiser una tarjeta de cupones de GoodRx.  - Si necesita que su receta se enve electrnicamente a una farmacia diferente, informe a nuestra oficina a travs de MyChart de [place] o por telfono llamando al [phone] y presione la opcin 4.

## 2021-12-22 ENCOUNTER — Encounter: Payer: Self-pay | Admitting: Dermatology

## 2022-11-10 IMAGING — CR DG CHEST 2V
1 series · 2 of 2 positions shown · non-contrast
Comparison: None

CLINICAL DATA: Chest and epigastric pain since last night

EXAM:
CHEST - 2 VIEW

[Series 1: dg chest 2 view · 0.14mm/px · 2 of 2 slices shown]
[im 1/2]
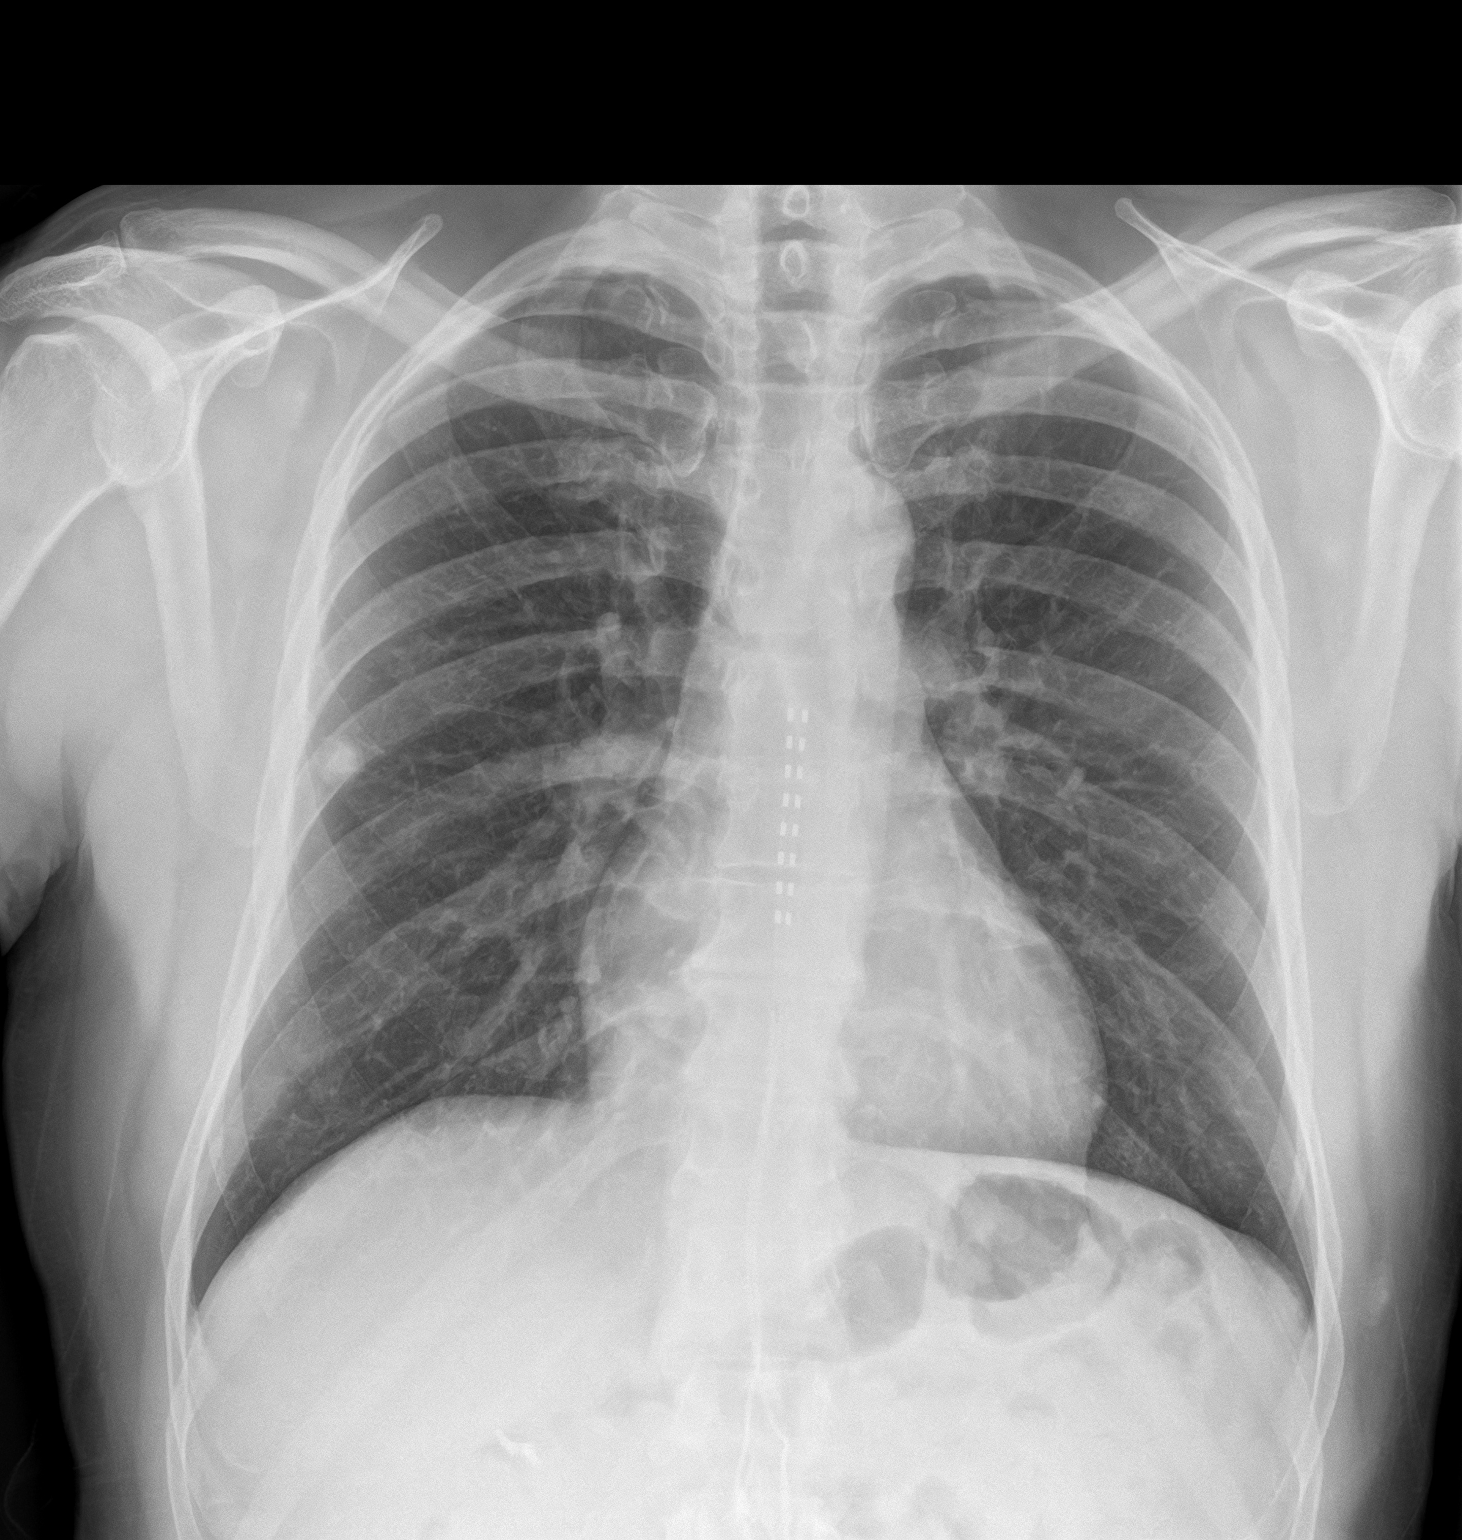
[im 2/2]
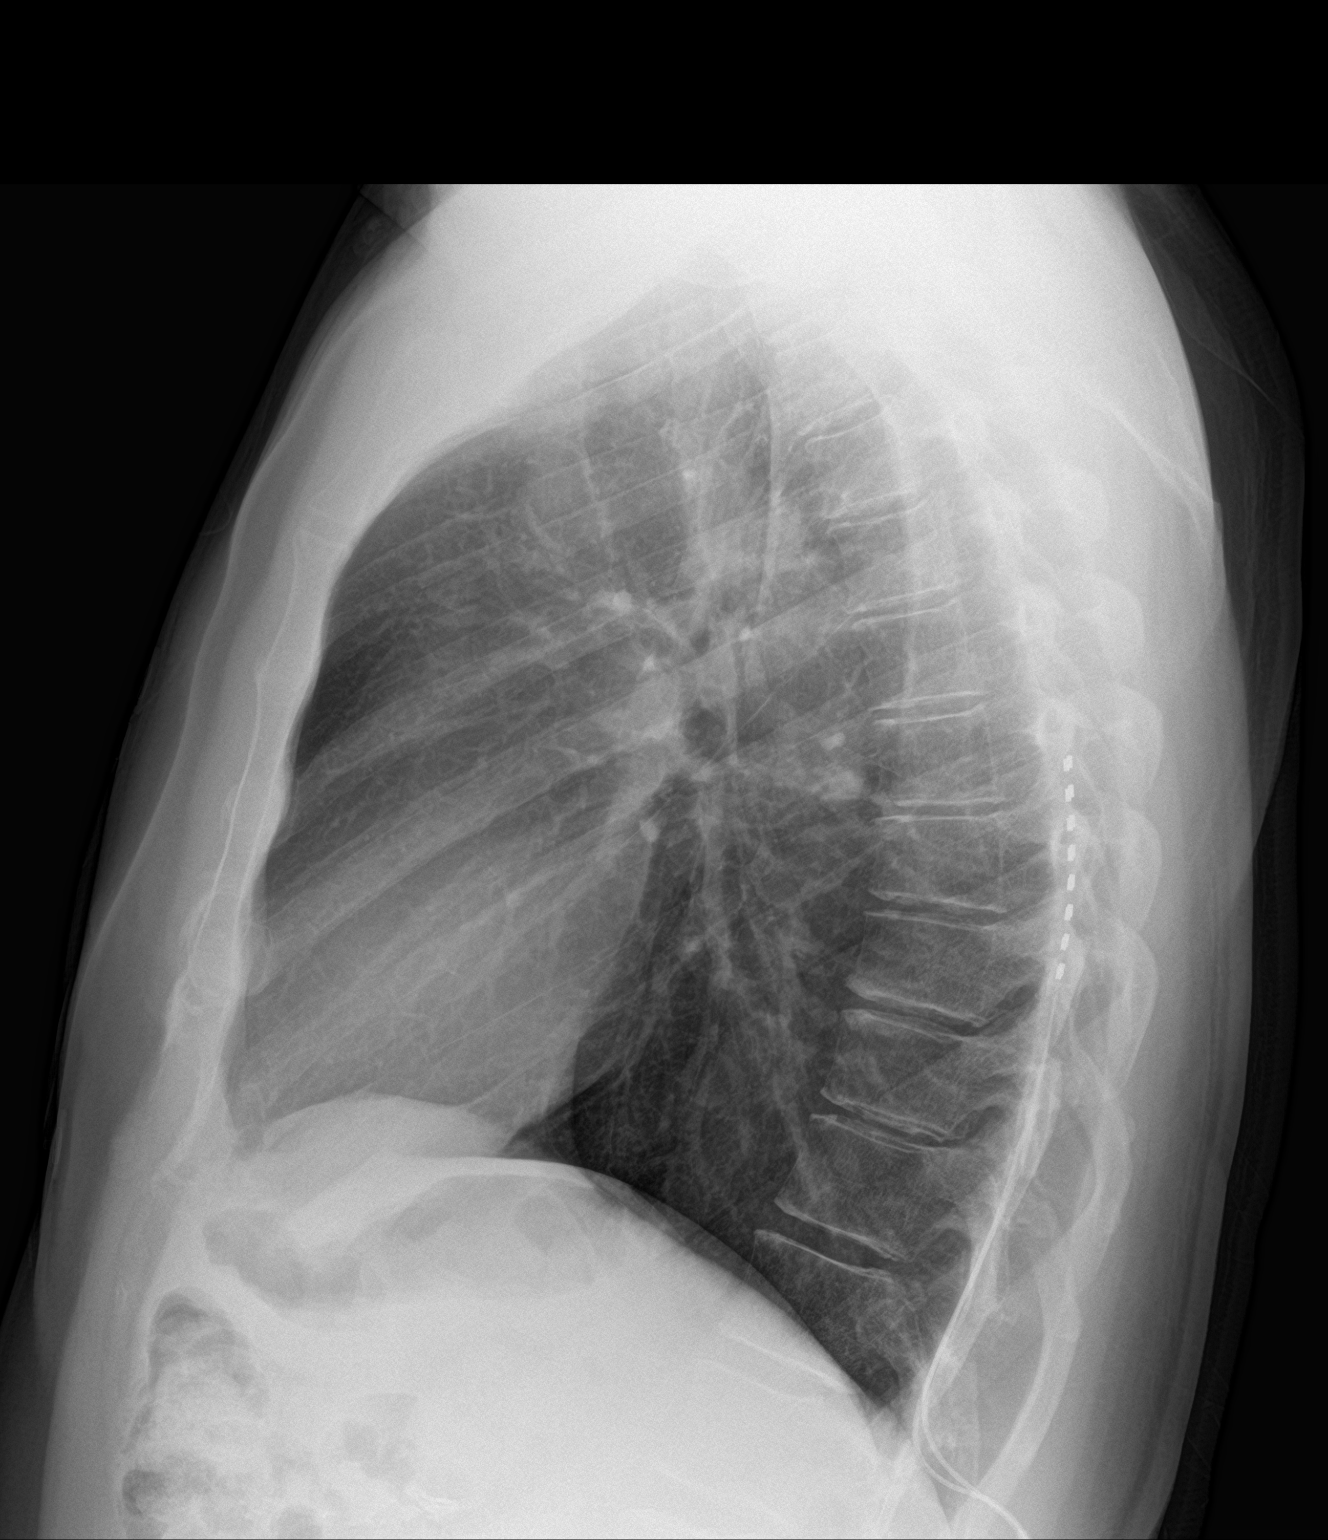

[2 of 2 positions shown; findings below may reference images not displayed]

FINDINGS: Intraspinal stimulator projects over midthoracic spine.

Normal heart size, mediastinal contours, and pulmonary vascularity.

Calcified granuloma RIGHT mid lung.

No pulmonary infiltrate, pleural effusion, or pneumothorax.

Osseous structures unremarkable.
IMPRESSION: Old granulomatous disease.

No acute abnormalities.
